# Patient Record
Sex: Female | Born: 1960 | Race: Black or African American | Hispanic: No | Marital: Married | Smoking: Current every day smoker
Health system: Southern US, Community
[De-identification: ages and names within clinical notes are randomized; demographics above are authoritative.]

## PROBLEM LIST (undated history)

## (undated) DIAGNOSIS — M47816 Spondylosis without myelopathy or radiculopathy, lumbar region: Secondary | ICD-10-CM

## (undated) DIAGNOSIS — M329 Systemic lupus erythematosus, unspecified: Secondary | ICD-10-CM

## (undated) DIAGNOSIS — E0500 Thyrotoxicosis with diffuse goiter without thyrotoxic crisis or storm: Secondary | ICD-10-CM

## (undated) DIAGNOSIS — IMO0002 Reserved for concepts with insufficient information to code with codable children: Secondary | ICD-10-CM

## (undated) DIAGNOSIS — M1990 Unspecified osteoarthritis, unspecified site: Secondary | ICD-10-CM

## (undated) HISTORY — DX: Unspecified osteoarthritis, unspecified site: M19.90

## (undated) HISTORY — PX: BREAST SURGERY: SHX581

## (undated) HISTORY — PX: THYROIDECTOMY: SHX17

## (undated) HISTORY — DX: Spondylosis without myelopathy or radiculopathy, lumbar region: M47.816

## (undated) HISTORY — PX: TUBAL LIGATION: SHX77

---

## 2011-10-12 DIAGNOSIS — D649 Anemia, unspecified: Secondary | ICD-10-CM | POA: Insufficient documentation

## 2011-10-12 DIAGNOSIS — F329 Major depressive disorder, single episode, unspecified: Secondary | ICD-10-CM | POA: Insufficient documentation

## 2011-10-12 DIAGNOSIS — K219 Gastro-esophageal reflux disease without esophagitis: Secondary | ICD-10-CM | POA: Insufficient documentation

## 2011-10-12 DIAGNOSIS — F32A Depression, unspecified: Secondary | ICD-10-CM | POA: Insufficient documentation

## 2011-10-12 DIAGNOSIS — G4700 Insomnia, unspecified: Secondary | ICD-10-CM | POA: Insufficient documentation

## 2011-10-12 DIAGNOSIS — E079 Disorder of thyroid, unspecified: Secondary | ICD-10-CM | POA: Insufficient documentation

## 2011-10-12 DIAGNOSIS — M2550 Pain in unspecified joint: Secondary | ICD-10-CM | POA: Insufficient documentation

## 2012-04-11 DIAGNOSIS — F410 Panic disorder [episodic paroxysmal anxiety] without agoraphobia: Secondary | ICD-10-CM | POA: Insufficient documentation

## 2012-10-23 DIAGNOSIS — F17200 Nicotine dependence, unspecified, uncomplicated: Secondary | ICD-10-CM | POA: Insufficient documentation

## 2012-10-23 DIAGNOSIS — L930 Discoid lupus erythematosus: Secondary | ICD-10-CM | POA: Insufficient documentation

## 2012-10-23 DIAGNOSIS — Z6829 Body mass index (BMI) 29.0-29.9, adult: Secondary | ICD-10-CM | POA: Insufficient documentation

## 2012-10-23 DIAGNOSIS — E785 Hyperlipidemia, unspecified: Secondary | ICD-10-CM | POA: Insufficient documentation

## 2014-01-13 ENCOUNTER — Encounter (HOSPITAL_COMMUNITY): Payer: Self-pay | Admitting: Emergency Medicine

## 2014-01-13 ENCOUNTER — Emergency Department (INDEPENDENT_AMBULATORY_CARE_PROVIDER_SITE_OTHER)
Admission: EM | Admit: 2014-01-13 | Discharge: 2014-01-13 | Disposition: A | Discharge status: Home / Self Care | Payer: Managed Care, Other (non HMO) | Source: Home / Self Care | Attending: Emergency Medicine | Admitting: Emergency Medicine

## 2014-01-13 DIAGNOSIS — K047 Periapical abscess without sinus: Secondary | ICD-10-CM

## 2014-01-13 MED ORDER — HYDROCODONE-ACETAMINOPHEN 5-325 MG PO TABS: 1.0000 | ORAL_TABLET | Freq: Four times a day (QID) | ORAL | Status: DC | PRN

## 2014-01-13 MED ORDER — PENICILLIN V POTASSIUM 500 MG PO TABS: 500.0000 mg | ORAL_TABLET | Freq: Four times a day (QID) | ORAL | Status: AC

## 2014-01-13 NOTE — ED Provider Notes (Addendum)
CSN: [id number]     Arrival date & time 01/13/14  2992 History   First MD Initiated Contact with Patient 01/13/14 [phone]     Chief Complaint  Patient presents with  . Dental Pain   (Consider location/radiation/quality/duration/timing/severity/associated sxs/prior Treatment) HPI She is a 54 year old woman here for evaluation of tooth pain. She states the pain started on Friday in the lower right jaw and has gradually been getting worse. This morning, she woke up with her right lower jaw swollen. She denies any fevers or chills. No nausea or vomiting. No drainage. She is relatively new to [place], and does not yet have a dentist here. She is planning on calling someone tomorrow.  She has been taking Tylenol with some mild improvement.  History reviewed. No pertinent past medical history. History reviewed. No pertinent past surgical history. History reviewed. No pertinent family history. History  Substance Use Topics  . Smoking status: Current Every Day Smoker -- 0.50 packs/day    Types: Cigarettes  . Smokeless tobacco: Not on file  . Alcohol Use: No   OB History    No data available     Review of Systems  Constitutional: Negative for fever.  HENT: Positive for dental problem.   Gastrointestinal: Negative for nausea and vomiting.    Allergies  Wellbutrin  Home Medications   Prior to Admission medications   Medication Sig Start Date End Date Taking? Authorizing Provider  diazepam (VALIUM) 5 MG tablet Take 5 mg by mouth every 6 (six) hours as needed for anxiety.   Yes Historical Provider, MD  fexofenadine-pseudoephedrine (ALLEGRA-D 24) 180-240 MG per 24 hr tablet Take 1 tablet by mouth daily.   Yes Historical Provider, MD  meloxicam (MOBIC) 15 MG tablet Take 15 mg by mouth daily.   Yes Historical Provider, MD  mometasone (ELOCON) 0.1 % ointment Apply topically daily.   Yes Historical Provider, MD  omeprazole (PRILOSEC) 20 MG capsule Take 20 mg by mouth daily.   Yes Historical  Provider, MD  zolpidem (AMBIEN) 10 MG tablet Take 10 mg by mouth at bedtime as needed for sleep.   Yes Historical Provider, MD  HYDROcodone-acetaminophen (NORCO) 5-325 MG per tablet Take 1 tablet by mouth every 6 (six) hours as needed for moderate pain. 01/13/14   Melony Overly, MD  penicillin v potassium (VEETID) 500 MG tablet Take 1 tablet (500 mg total) by mouth 4 (four) times daily. 01/13/14 01/20/14  Melony Overly, MD   BP 132/84 mmHg  Pulse 112  Temp(Src) 98.4 F (36.9 C) (Oral)  Resp 18  SpO2 100% Physical Exam  Constitutional: She is oriented to person, place, and time. She appears well-developed and well-nourished. No distress.  HENT:  Mouth/Throat: Oropharynx is clear and moist. Mucous membranes are not dry. Dental abscesses present.    Cardiovascular: Normal rate.   Pulmonary/Chest: Effort normal.  Neurological: She is alert and oriented to person, place, and time.    ED Course  Procedures (including critical care time) Labs Review Labs Reviewed - No data to display  Imaging Review No results found.   MDM   1. Dental abscess    Penicillin for 10 days. Norco 5-325 milligrams tablets, #30 provided. Alternate heat and ice to the jaw. Discussed importance of following up with a dentist.    Melony Overly, MD 01/13/14 [place], MD 01/13/14 [phone]

## 2014-01-13 NOTE — ED Notes (Signed)
Reports abscess of right lower bottom teeth.  Pain on set Friday.  States woke this morning with swelling of right side of face.  Mild relief with otc meds.

## 2014-01-13 NOTE — Discharge Instructions (Signed)
Your tooth is infected. Take penicillin 1 pill 4 times a day for 10 days. Use the Norco every 4-6 hours as needed for severe pain.  Do not drive while on this medicine. Ibuprofen and tylenol will help some. Alternate heat and ice to the jaw.  Follow up with a dentist as soon as possible.

## 2014-09-11 ENCOUNTER — Other Ambulatory Visit: Payer: Self-pay | Admitting: Nurse Practitioner

## 2014-09-11 ENCOUNTER — Other Ambulatory Visit: Payer: Self-pay

## 2014-09-11 DIAGNOSIS — Z1231 Encounter for screening mammogram for malignant neoplasm of breast: Secondary | ICD-10-CM

## 2014-11-04 DIAGNOSIS — M47816 Spondylosis without myelopathy or radiculopathy, lumbar region: Secondary | ICD-10-CM | POA: Insufficient documentation

## 2014-11-04 DIAGNOSIS — M5416 Radiculopathy, lumbar region: Secondary | ICD-10-CM | POA: Insufficient documentation

## 2014-12-02 DIAGNOSIS — M48061 Spinal stenosis, lumbar region without neurogenic claudication: Secondary | ICD-10-CM | POA: Insufficient documentation

## 2015-06-30 DIAGNOSIS — L930 Discoid lupus erythematosus: Secondary | ICD-10-CM | POA: Insufficient documentation

## 2015-06-30 DIAGNOSIS — H18419 Arcus senilis, unspecified eye: Secondary | ICD-10-CM | POA: Insufficient documentation

## 2015-06-30 DIAGNOSIS — Z79899 Other long term (current) drug therapy: Secondary | ICD-10-CM | POA: Insufficient documentation

## 2015-06-30 DIAGNOSIS — H18413 Arcus senilis, bilateral: Secondary | ICD-10-CM | POA: Insufficient documentation

## 2015-12-16 ENCOUNTER — Encounter: Payer: Self-pay | Admitting: Internal Medicine

## 2016-01-08 ENCOUNTER — Ambulatory Visit (INDEPENDENT_AMBULATORY_CARE_PROVIDER_SITE_OTHER): Payer: BLUE CROSS/BLUE SHIELD

## 2016-01-08 ENCOUNTER — Ambulatory Visit (INDEPENDENT_AMBULATORY_CARE_PROVIDER_SITE_OTHER): Payer: BLUE CROSS/BLUE SHIELD | Admitting: Podiatry

## 2016-01-08 ENCOUNTER — Encounter: Payer: Self-pay | Admitting: Podiatry

## 2016-01-08 VITALS — BP 105/80 | Resp 16 | Ht 67.0 in | Wt 153.0 lb

## 2016-01-08 DIAGNOSIS — M79674 Pain in right toe(s): Secondary | ICD-10-CM | POA: Diagnosis not present

## 2016-01-08 DIAGNOSIS — L600 Ingrowing nail: Secondary | ICD-10-CM

## 2016-01-08 NOTE — Patient Instructions (Signed)

## 2016-01-09 NOTE — Progress Notes (Signed)
Subjective:     Patient ID: Alison Carter, female   DOB: 1960/08/27, 56 y.o.   MRN: [id number]  HPI patient states she's had increased problems with ingrown toenails of the big toenail and second toenail right foot medial side and they have been increasingly sore when she tries to wear shoes or even sheets at night when the touch it. She is states she's not noted drainage and she also gets some occasional numbness in her feet   Review of Systems  All other systems reviewed and are negative.      Objective:   Physical Exam  Constitutional: She is oriented to person, place, and time.  Cardiovascular: Intact distal pulses.   Musculoskeletal: Normal range of motion.  Neurological: She is oriented to person, place, and time.  Skin: Skin is warm.  Nursing note and vitals reviewed.  neurovascular status is found to be intact with muscle strength adequate range of motion within normal limits with patient noted to have incurvated hallux and second nails right foot medial side that are painful when pressed and makes shoe gear difficult. Patient also has mild numbness sensations but I was unable to elicit any change and had a previous surgery right fifth digit which is healed well with no problems. The patient has no distal drainage noted and has good digital perfusion and is well oriented 3     Assessment:     Ingrown toenail deformity right hallux second toes right that are painful when pressed with possible mild neuropathic changes    Plan:     H&P conditions reviewed and patient wants correction. I explained procedures and risk and patient understands this wanting surgery and today I infiltrated the right hallux and second toe with 120 mg Xylocaine Marcaine mixture and under sterile conditions remove the medial border of the right hallux and second toe exposed matrix and applied phenol 3 applications 30 seconds followed by alcohol lavage and sterile dressing. Gave instructions on soaks and  reappoint

## 2016-01-10 ENCOUNTER — Encounter: Payer: Self-pay | Admitting: Podiatry

## 2016-01-10 NOTE — Progress Notes (Signed)
Telephone call Spoke with patient this afternoon regarding blisters to her toes. Patient was last seen on 01/08/16 by Dr. Paulla Dolly for ingrown toenails. Partial nail avulsions performed with chemical matrixectomy.   Patient states that she has blisters on her toes today and is concerned.   Plan:  1. Rx for doxycycline 100mg  #20 called into pharmacy 2. Continue epsom salt or antibacterial soaks 3. Call office Monday mo rning for follow up appt with Dr. Paulla Dolly

## 2016-01-11 ENCOUNTER — Telehealth: Payer: Self-pay | Admitting: Podiatry

## 2016-01-11 ENCOUNTER — Encounter (HOSPITAL_BASED_OUTPATIENT_CLINIC_OR_DEPARTMENT_OTHER): Payer: Self-pay | Admitting: Emergency Medicine

## 2016-01-11 ENCOUNTER — Emergency Department (HOSPITAL_BASED_OUTPATIENT_CLINIC_OR_DEPARTMENT_OTHER): Payer: BLUE CROSS/BLUE SHIELD

## 2016-01-11 ENCOUNTER — Emergency Department (HOSPITAL_BASED_OUTPATIENT_CLINIC_OR_DEPARTMENT_OTHER)
Admission: EM | Admit: 2016-01-11 | Discharge: 2016-01-12 | Disposition: A | Discharge status: Home / Self Care | Payer: BLUE CROSS/BLUE SHIELD | Attending: Emergency Medicine | Admitting: Emergency Medicine

## 2016-01-11 DIAGNOSIS — Y793 Surgical instruments, materials and orthopedic devices (including sutures) associated with adverse incidents: Secondary | ICD-10-CM | POA: Insufficient documentation

## 2016-01-11 DIAGNOSIS — T819XXA Unspecified complication of procedure, initial encounter: Secondary | ICD-10-CM

## 2016-01-11 DIAGNOSIS — Z79899 Other long term (current) drug therapy: Secondary | ICD-10-CM | POA: Insufficient documentation

## 2016-01-11 DIAGNOSIS — F17210 Nicotine dependence, cigarettes, uncomplicated: Secondary | ICD-10-CM | POA: Diagnosis not present

## 2016-01-11 HISTORY — DX: Systemic lupus erythematosus, unspecified: M32.9

## 2016-01-11 HISTORY — DX: Thyrotoxicosis with diffuse goiter without thyrotoxic crisis or storm: E05.00

## 2016-01-11 HISTORY — DX: Reserved for concepts with insufficient information to code with codable children: IMO0002

## 2016-01-11 NOTE — Telephone Encounter (Signed)
Patient called this evening for concern of swelling to her foot. Patient recently had ingrown toenails removed on 01/08/2016 care of Dr. Paulla Dolly. Patient called on 01/10/2016 for concern of blisters to the respective digits of the ingrown toenail removal. At that time prescription for antibiotic was called into the pharmacy.  Today the patient states that she fell and her blisters ruptured. She states that she is now swollen up to the level of the ankle. Patient also states that her toenails are discolored with a grayish discoloration.  Plan of care: Today I recommended that the patient follow up with the emergency department due to worsening conditions. Instructed patient to call in the a.m. for follow-up appointment.  Edrick Kins, DPM [facility]  Dr. Edrick Kins, [address]                                        [place], [place] [address]                Office [phone]  Fax [phone]

## 2016-01-11 NOTE — ED Triage Notes (Addendum)
R foot pain. Pt had ingrown toe nail removed on 1st two toe nails on Thursday. Pt reports fluid is built up around both toes. Pt states she called her doctor who prescribed abx. Pt states she tripped and fell tonight and the blisters burst. Doc advised pt to be seen in ED. Denies LOC. Pt foot swollen with open wounds to both toes.

## 2016-01-11 NOTE — ED Provider Notes (Addendum)
[facility] Provider Note: Georgena Spurling, MD, FACEP  CSN: [id number] MRN: [id number] ARRIVAL: 01/11/16 at 2017 ROOM: MH08/MH08  By signing my name below, I, Neta Mends, attest that this documentation has been prepared under the direction and in the presence of Shanon Rosser, MD . Electronically Signed: Neta Mends, ED Scribe. 01/11/2016. 11:16 PM.   CHIEF COMPLAINT  Foot Pain   HISTORY OF PRESENT ILLNESS  Alison Carter is a 56 y.o. female who had ingrown toenails removed on 1st and 2nd right toes on 01/08/16. Pt has been soaking her toes in antibacterial soap twice a day and applying dressings as instructed, and first noticed blisters forming around the toes yesterday. She contacted her podiatrist who thought she may have had a reaction to the phenol used in the procedure and he prescribed doxycycline. Pt reports that she tripped and fell this evening and the blisters on her toes burst. Pt notes that the pain in her toes resolved after the blisters broke. Pt reports that she Pt has an appointment with her podiatrist tomorrow. There is no new deformity of the toes and sensation and gross motor function are intact.   Past Medical History:  Diagnosis Date  . Graves disease   . Lupus     Past Surgical History:  Procedure Laterality Date  . BREAST SURGERY    . THYROIDECTOMY    . TUBAL LIGATION      No family history on file.  Social History  Substance Use Topics  . Smoking status: Current Every Day Smoker    Packs/day: 0.50    Types: Cigarettes  . Smokeless tobacco: Never Used  . Alcohol use No    Prior to Admission medications   Medication Sig Start Date End Date Taking? Authorizing Provider  doxycycline (VIBRAMYCIN) 100 MG capsule Take 100 mg by mouth 2 (two) times daily.   Yes Historical Provider, MD  acetaminophen (TYLENOL) 500 MG tablet Take 2 mg by mouth.    Historical Provider, MD  diazepam (VALIUM) 5 MG tablet Take 5 mg by mouth every 6 (six)  hours as needed for anxiety.    Historical Provider, MD  escitalopram (LEXAPRO) 10 MG tablet TK 1/2 T PO D FOR 1 WEEK THEN INCREASE TO 1 T D QAM WITH FOOD 12/17/15   Historical Provider, MD  fexofenadine-pseudoephedrine (ALLEGRA-D 24) 180-240 MG per 24 hr tablet Take 1 tablet by mouth daily.    Historical Provider, MD  gabapentin (NEURONTIN) 100 MG capsule TK 1 TO 3 CS PO HS PRF LEG PAIN 12/08/15   Historical Provider, MD  hydroxychloroquine (PLAQUENIL) 200 MG tablet Take 200 mg by mouth. 10/02/13   Historical Provider, MD  ibuprofen (ADVIL,MOTRIN) 600 MG tablet Take 600 mg by mouth. 07/03/15 07/02/16  Historical Provider, MD  mometasone (ELOCON) 0.1 % ointment Apply topically daily.    Historical Provider, MD  omeprazole (PRILOSEC) 20 MG capsule Take 20 mg by mouth daily.    Historical Provider, MD  traMADol (ULTRAM) 50 MG tablet TK 1 T PO Q 6 H PRN 12/02/15   Historical Provider, MD  zolpidem (AMBIEN) 10 MG tablet Take 10 mg by mouth at bedtime as needed for sleep.    Historical Provider, MD    Allergies Wellbutrin [bupropion]   REVIEW OF SYSTEMS  Negative except as noted here or in the History of Present Illness.   PHYSICAL EXAMINATION  Initial Vital Signs Blood pressure 151/81, pulse 100, temperature 97.9 F (36.6 C), temperature source Oral, resp.  rate 18, height 5\' 7"  (1.702 m), weight 153 lb (69.4 kg), SpO2 100 %.  Examination General: Well-developed, well-nourished female in no acute distress; appearance consistent with age of record HENT: normocephalic; atraumatic Eyes: pupils equal, round and reactive to light; extraocular muscles intact; arcus senilis bilaterally Neck: supple Heart: regular rate and rhythm Lungs: clear to auscultation bilaterally Abdomen: soft; nondistended; nontender; bowel sounds present Extremities: Right 1st and 2nd toes pink with ruptured bullae, partial denudation of epidermis and obvious recent surgical changes to nails, grossly intact tendon function  and sensation with minimal tenderness; no deformity; pulses normal Neurologic: Awake, alert and oriented; motor function intact in all extremities and symmetric; no facial droop Skin: Warm and dry Psychiatric: Normal mood and affect     RESULTS  Summary of this visit's results, reviewed by myself:   EKG Interpretation  Date/Time:    Ventricular Rate:    PR Interval:    QRS Duration:   QT Interval:    QTC Calculation:   R Axis:     Text Interpretation:        Laboratory Studies: No results found for this or any previous visit (from the past 24 hour(s)). Imaging Studies: Dg Foot Complete Right  Result Date: 01/11/2016 CLINICAL DATA:  Pain following fall EXAM: RIGHT FOOT COMPLETE - 3+ VIEW COMPARISON:  January 08, 2016 FINDINGS: Frontal, oblique, and lateral views were obtained. There is no fracture or dislocation. Joint spaces appear normal. There is a small inferior calcaneal spur. There is no soft tissue air or calcification. No erosive change. IMPRESSION: No evident fracture or dislocation. No apparent arthropathy. No soft tissue air or calcification. Minimal inferior calcaneal spur. Electronically Signed   By: Lowella Grip III M.D.   On: 01/11/2016 21:09    ED COURSE  Nursing notes and initial vitals signs, including pulse oximetry, reviewed.  Vitals:   01/11/16 2039 01/11/16 2342  BP: 151/81 130/64  Pulse: 100 77  Resp: 18 17  Temp: 97.9 F (36.6 C)   TempSrc: Oral   SpO2: 100% 100%  Weight: 153 lb (69.4 kg)   Height: 5\' 7"  (1.702 m)    We'll dress with Xeroform gauze and she will follow-up with her podiatrist tomorrow.  PROCEDURES    ED DIAGNOSES     ICD-9-CM ICD-10-CM   1. Postoperative or surgical complication, initial encounter 998.9 T81.9XXA    I personally performed the services described in this documentation, which was scribed in my presence. The recorded information has been reviewed and is accurate.     Shanon Rosser, MD 01/11/16 [place], MD 01/12/16 [phone]

## 2016-01-12 ENCOUNTER — Ambulatory Visit (INDEPENDENT_AMBULATORY_CARE_PROVIDER_SITE_OTHER): Payer: BLUE CROSS/BLUE SHIELD | Admitting: Podiatry

## 2016-01-12 ENCOUNTER — Encounter: Payer: Self-pay | Admitting: Podiatry

## 2016-01-12 VITALS — BP 131/89 | Temp 97.6°F

## 2016-01-12 DIAGNOSIS — L03031 Cellulitis of right toe: Secondary | ICD-10-CM | POA: Diagnosis not present

## 2016-01-12 MED ORDER — HYDROCODONE-ACETAMINOPHEN 10-325 MG PO TABS
1.0000 | ORAL_TABLET | Freq: Three times a day (TID) | ORAL | 0 refills | Status: AC | PRN
Start: 2016-01-12 — End: ?

## 2016-01-14 NOTE — Progress Notes (Signed)
Subjective:     Patient ID: Alison Carter, female   DOB: [birth year], 56 y.o.   MRN: [id number]  HPI patient presents stating that a few days after the nails that she started to develop some blisters on her big toe and second toe and she did go to the emergency room and they put dressings on it and she is on an antibiotic currently. Patient is not having any pain and states that this areas we did the ingrown toenails are healing well   Review of Systems     Objective:   Physical Exam Neurovascular status was intact muscle strength was adequate with patient found to have small irritations on the right and left hallux and the proximal portion measuring approximately 7 mm in length by 2 mm in width with no subcutaneous exposure and no proximal edema erythema drainage noted. The areas that we did the surgery are doing well and crusting over in a normal fashion from ingrown toenail procedure and patient has had no systemic indications of infection    Assessment:     Localized irritation which may have been a response to soaking or other modality with no indication of systemic pathology    Plan:     Instructed on continued soaking and allowing them to air dry and did write a prescription for Silvadene to try to reduce the irritation of tissue. I then went ahead and advised on continuing the antibiotic at the current time and patient will be seen back for [place] to recheck again in 1 week or earlier if any issues should occur

## 2016-01-15 ENCOUNTER — Telehealth: Payer: Self-pay | Admitting: *Deleted

## 2016-01-15 ENCOUNTER — Encounter: Payer: Self-pay | Admitting: Podiatry

## 2016-01-15 NOTE — Telephone Encounter (Signed)
Pt states she is still having issues with her toes. I spoke with pt and she states her toes are still swollen and oozy. I told pt that they may be swollen and oozy, and painful for about 4-6 weeks I told her to continue doxycycline, the epsom salt soaks and lightly coated antibiotic ointment bandaid. I told pt to keep her 01/21/2016 appt, and if she should have increase swelling, pain, or streaking or fever to call for an earlier appt.

## 2016-01-21 ENCOUNTER — Ambulatory Visit: Payer: BLUE CROSS/BLUE SHIELD | Admitting: Podiatry

## 2016-01-22 ENCOUNTER — Ambulatory Visit: Payer: BLUE CROSS/BLUE SHIELD | Admitting: Podiatry

## 2016-01-23 ENCOUNTER — Encounter: Payer: Self-pay | Admitting: Podiatry

## 2016-01-23 ENCOUNTER — Ambulatory Visit (INDEPENDENT_AMBULATORY_CARE_PROVIDER_SITE_OTHER): Payer: BLUE CROSS/BLUE SHIELD | Admitting: Podiatry

## 2016-01-23 ENCOUNTER — Ambulatory Visit (INDEPENDENT_AMBULATORY_CARE_PROVIDER_SITE_OTHER): Payer: BLUE CROSS/BLUE SHIELD

## 2016-01-23 VITALS — BP 110/74 | HR 109 | Resp 16

## 2016-01-23 DIAGNOSIS — L03031 Cellulitis of right toe: Secondary | ICD-10-CM | POA: Diagnosis not present

## 2016-01-25 NOTE — Progress Notes (Signed)
Subjective:     Patient ID: Alison Carter, female   DOB: 1960/06/19, 56 y.o.   MRN: [id number]  HPI patient presents stating I was just concerned about this right big toe and wanted to make sure it was healing okay. I am not having any pain currently   Review of Systems     Objective:   Physical Exam Neurovascular status is intact toes are warm with crusted tissue right big toe localized in nature. There is no necrotic tissue formation and it does appear to gradually be peeling off and I did not note proximal edema erythema or drainage noted    Assessment:     Patient is gradually healing from nail surgery with continued discoloration which I do believe is going to resolve over time with no indication currently of necrosis or infection    Plan:     Explained continued soaks bandage is and the tissue should gradually sloughed off. I explained that if any dark tissue should form or anything indications of redness drainage or other issues I want her to reappoint immediately

## 2016-02-02 ENCOUNTER — Ambulatory Visit (AMBULATORY_SURGERY_CENTER): Payer: Self-pay | Admitting: *Deleted

## 2016-02-02 VITALS — Ht 67.0 in | Wt 159.0 lb

## 2016-02-02 DIAGNOSIS — Z1211 Encounter for screening for malignant neoplasm of colon: Secondary | ICD-10-CM

## 2016-02-02 MED ORDER — NA SULFATE-K SULFATE-MG SULF 17.5-3.13-1.6 GM/177ML PO SOLN: ORAL | 0 refills | Status: AC

## 2016-02-02 NOTE — Progress Notes (Signed)
Patient denies any allergies to eggs or soy. Patient denies any problems with anesthesia/sedation. Patient denies any oxygen use at home and does not take any diet/weight loss medications. EMMI education declined by patient.  

## 2016-02-16 ENCOUNTER — Ambulatory Visit (AMBULATORY_SURGERY_CENTER): Payer: BLUE CROSS/BLUE SHIELD | Admitting: Internal Medicine

## 2016-02-16 ENCOUNTER — Encounter: Payer: Self-pay | Admitting: Internal Medicine

## 2016-02-16 VITALS — BP 140/77 | HR 72 | Temp 98.4°F | Resp 21 | Ht 67.0 in

## 2016-02-16 DIAGNOSIS — D125 Benign neoplasm of sigmoid colon: Secondary | ICD-10-CM

## 2016-02-16 DIAGNOSIS — Z1212 Encounter for screening for malignant neoplasm of rectum: Secondary | ICD-10-CM | POA: Diagnosis not present

## 2016-02-16 DIAGNOSIS — D120 Benign neoplasm of cecum: Secondary | ICD-10-CM | POA: Diagnosis not present

## 2016-02-16 DIAGNOSIS — Z1211 Encounter for screening for malignant neoplasm of colon: Secondary | ICD-10-CM | POA: Diagnosis not present

## 2016-02-16 DIAGNOSIS — K635 Polyp of colon: Secondary | ICD-10-CM

## 2016-02-16 MED ORDER — SODIUM CHLORIDE 0.9 % IV SOLN: 500.0000 mL | INTRAVENOUS | Status: AC

## 2016-02-16 MED ORDER — DEXTROSE 5 % IV SOLN: INTRAVENOUS | Status: AC

## 2016-02-16 NOTE — Progress Notes (Signed)
A/ox3, pleased with MAC, report to RN 

## 2016-02-16 NOTE — Op Note (Signed)
[place] Patient Name: Alison Carter Procedure Date: 02/16/2016 11:09 AM MRN: [id number] Endoscopist: Jerene Bears , MD Age: 56 Referring MD:  Date of Birth: 1960/10/28 Gender: Female Account #: [id number] Procedure:                Colonoscopy Indications:              Screening for colorectal malignant neoplasm, This                            is the patient's first colonoscopy Medicines:                Monitored Anesthesia Care Procedure:                Pre-Anesthesia Assessment:                           - Prior to the procedure, a History and Physical                            was performed, and patient medications and                            allergies were reviewed. The patient's tolerance of                            previous anesthesia was also reviewed. The risks                            and benefits of the procedure and the sedation                            options and risks were discussed with the patient.                            All questions were answered, and informed consent                            was obtained. Prior Anticoagulants: The patient has                            taken no previous anticoagulant or antiplatelet                            agents. ASA Grade Assessment: II - A patient with                            mild systemic disease. After reviewing the risks                            and benefits, the patient was deemed in                            satisfactory condition to undergo the procedure.  After obtaining informed consent, the colonoscope                            was passed under direct vision. Throughout the                            procedure, the patient's blood pressure, pulse, and                            oxygen saturations were monitored continuously. The                            Model PCF-H190L [id number]) scope was introduced                            through the anus and  advanced to the the cecum,                            identified by appendiceal orifice and ileocecal                            valve. The colonoscopy was performed without                            difficulty. The patient tolerated the procedure                            well. The quality of the bowel preparation was                            good. The ileocecal valve, appendiceal orifice, and                            rectum were photographed. Scope In: 11:18:43 AM Scope Out: 11:33:42 AM Scope Withdrawal Time: 0 hours 11 minutes 51 seconds  Total Procedure Duration: 0 hours 14 minutes 59 seconds  Findings:                 The perianal and digital rectal examinations were                            normal.                           A 6 mm polyp was found in the ileocecal valve. The                            polyp was sessile. The polyp was removed with a                            cold snare. Resection and retrieval were complete.                           A 5 mm polyp was found in the sigmoid colon. The  polyp was sessile. The polyp was removed with a                            cold snare. Resection and retrieval were complete.                           A few small-mouthed diverticula were found in the                            descending colon.                           The exam was otherwise without abnormality on                            direct and retroflexion views. Complications:            No immediate complications. Estimated Blood Loss:     Estimated blood loss was minimal. Impression:               - One 6 mm polyp at the ileocecal valve, removed                            with a cold snare. Resected and retrieved.                           - One 5 mm polyp in the sigmoid colon, removed with                            a cold snare. Resected and retrieved.                           - Very mild diverticulosis in the descending colon.                            - The examination was otherwise normal on direct                            and retroflexion views. Recommendation:           - Patient has a contact number available for                            emergencies. The signs and symptoms of potential                            delayed complications were discussed with the                            patient. Return to normal activities tomorrow.                            Written discharge instructions were provided to the  patient.                           - Resume previous diet.                           - Continue present medications.                           - Await pathology results.                           - Repeat colonoscopy is recommended. The                            colonoscopy date will be determined after pathology                            results from today's exam become available for                            review. Jerene Bears, MD 02/16/2016 11:36:36 AM This report has been signed electronically.

## 2016-02-16 NOTE — Progress Notes (Signed)
Called to room to assist during endoscopic procedure.  Patient ID and intended procedure confirmed with present staff. Received instructions for my participation in the procedure from the performing physician.  

## 2016-02-16 NOTE — Patient Instructions (Signed)
YOU HAD AN ENDOSCOPIC PROCEDURE TODAY AT THE  ENDOSCOPY CENTER:   Refer to the procedure report that was given to you for any specific questions about what was found during the examination.  If the procedure report does not answer your questions, please call your gastroenterologist to clarify.  If you requested that your care partner not be given the details of your procedure findings, then the procedure report has been included in a sealed envelope for you to review at your convenience later.  YOU SHOULD EXPECT: Some feelings of bloating in the abdomen. Passage of more gas than usual.  Walking can help get rid of the air that was put into your GI tract during the procedure and reduce the bloating. If you had a lower endoscopy (such as a colonoscopy or flexible sigmoidoscopy) you may notice spotting of blood in your stool or on the toilet paper. If you underwent a bowel prep for your procedure, you may not have a normal bowel movement for a few days.  Please Note:  You might notice some irritation and congestion in your nose or some drainage.  This is from the oxygen used during your procedure.  There is no need for concern and it should clear up in a day or so.  SYMPTOMS TO REPORT IMMEDIATELY:   Following lower endoscopy (colonoscopy or flexible sigmoidoscopy):  Excessive amounts of blood in the stool  Significant tenderness or worsening of abdominal pains  Swelling of the abdomen that is new, acute  Fever of 100F or higher   For urgent or emergent issues, a gastroenterologist can be reached at any hour by calling [phone].   DIET:  We do recommend a small meal at first, but then you may proceed to your regular diet.  Drink plenty of fluids but you should avoid alcoholic beverages for 24 hours.  ACTIVITY:  You should plan to take it easy for the rest of today and you should NOT DRIVE or use heavy machinery until tomorrow (because of the sedation medicines used during the test).     FOLLOW UP: Our staff will call the number listed on your records the next business day following your procedure to check on you and address any questions or concerns that you may have regarding the information given to you following your procedure. If we do not reach you, we will leave a message.  However, if you are feeling well and you are not experiencing any problems, there is no need to return our call.  We will assume that you have returned to your regular daily activities without incident.  If any biopsies were taken you will be contacted by phone or by letter within the next 1-3 weeks.  Please call us at [phone] if you have not heard about the biopsies in 3 weeks.    SIGNATURES/CONFIDENTIALITY: You and/or your care partner have signed paperwork which will be entered into your electronic medical record.  These signatures attest to the fact that that the information above on your After Visit Summary has been reviewed and is understood.  Full responsibility of the confidentiality of this discharge information lies with you and/or your care-partner.  Read all of the handouts given to you by your recovery room nurse. Thank-you for choosing us for your healthcare needs today. 

## 2016-02-17 ENCOUNTER — Telehealth: Payer: Self-pay | Admitting: *Deleted

## 2016-02-17 NOTE — Telephone Encounter (Signed)
  Follow up Call-  Call back number 02/16/2016  Post procedure Call Back phone  # [phone]  Permission to leave phone message Yes     Patient questions:  Do you have a fever, pain , or abdominal swelling? No. Pain Score  0 *  Have you tolerated food without any problems? Yes.    Have you been able to return to your normal activities? Yes.    Do you have any questions about your discharge instructions: Diet   No. Medications  No. Follow up visit  No.  Do you have questions or concerns about your Care? No.  Actions: * If pain score is 4 or above: No action needed, pain <4.

## 2016-02-24 ENCOUNTER — Encounter: Payer: Self-pay | Admitting: Internal Medicine

## 2016-03-04 ENCOUNTER — Encounter: Payer: Self-pay | Admitting: Podiatry

## 2016-03-04 ENCOUNTER — Ambulatory Visit (INDEPENDENT_AMBULATORY_CARE_PROVIDER_SITE_OTHER): Payer: BLUE CROSS/BLUE SHIELD | Admitting: Podiatry

## 2016-03-04 DIAGNOSIS — L03031 Cellulitis of right toe: Secondary | ICD-10-CM | POA: Diagnosis not present

## 2016-03-05 NOTE — Progress Notes (Signed)
Subjective:     Patient ID: Alison Carter, female   DOB: 1960/07/12, 56 y.o.   MRN: [id number]  HPI patient presents stating she wanted to have these nails checked again stating that she's feeling better with diminished pain but she still has discoloration   Review of Systems     Objective:   Physical Exam Neurovascular status intact with discoloration hallux second toe but no blistering and no open skin formation currently. There is good cap fill time and the toes are warm and the nailbeds are healing with crusted tissue    Assessment:     Doing well with improvement of a localized condition hallux second toe with no indications of necrosis or pathology    Plan:     Instructed on continued warm water soaks padding of the areas and the should heal uneventfully. Reappoint as needed

## 2017-01-03 DIAGNOSIS — Z79899 Other long term (current) drug therapy: Secondary | ICD-10-CM | POA: Diagnosis not present

## 2017-01-03 DIAGNOSIS — M5432 Sciatica, left side: Secondary | ICD-10-CM | POA: Diagnosis not present

## 2017-01-03 DIAGNOSIS — F17210 Nicotine dependence, cigarettes, uncomplicated: Secondary | ICD-10-CM | POA: Insufficient documentation

## 2017-01-03 DIAGNOSIS — M545 Low back pain: Secondary | ICD-10-CM | POA: Diagnosis present

## 2017-01-04 ENCOUNTER — Emergency Department (HOSPITAL_COMMUNITY)
Admission: EM | Admit: 2017-01-04 | Discharge: 2017-01-04 | Disposition: A | Discharge status: Home / Self Care | Payer: BLUE CROSS/BLUE SHIELD | Attending: Emergency Medicine | Admitting: Emergency Medicine

## 2017-01-04 ENCOUNTER — Encounter (HOSPITAL_COMMUNITY): Payer: Self-pay

## 2017-01-04 DIAGNOSIS — M5432 Sciatica, left side: Secondary | ICD-10-CM

## 2017-01-04 MED ORDER — IBUPROFEN 400 MG PO TABS: 400.0000 mg | ORAL_TABLET | Freq: Three times a day (TID) | ORAL | 0 refills | Status: AC | PRN

## 2017-01-04 MED ORDER — METHOCARBAMOL 500 MG PO TABS: 500.0000 mg | ORAL_TABLET | Freq: Three times a day (TID) | ORAL | 0 refills | Status: AC | PRN

## 2017-01-04 MED ORDER — PREDNISONE 20 MG PO TABS: 40.0000 mg | ORAL_TABLET | Freq: Every day | ORAL | 0 refills | Status: AC

## 2017-01-04 NOTE — ED Provider Notes (Signed)
[facility] DEPT Provider Note   CSN: [id number] Arrival date & time: 01/03/17  2334     History   Chief Complaint Chief Complaint  Patient presents with  . Back Pain    HPI Alison Carter is a 57 y.o. female.  HPI Patient is a 57 year old female presents the emergency department 3 days of worsening left low back pain with radiation towards her left buttock and down her left thigh.  She has a history of sciatica and a history of herniated disc.  She states she was fine with a lumbar herniated disc on MRI 2 years ago.  She follows it with her sports medicine physician in [place] for intermittent injections.  She reports worsening of her pain over the past several days unrelieved by her normal gabapentin.  No weakness of her legs.  Denies abdominal pain.  No urinary complaints.  No fevers or chills.  Pain is moderate in severity.  And constant   Past Medical History:  Diagnosis Date  . Arthritis   . Degenerative arthritis of lumbar spine   . Graves disease   . Lupus     Patient Active Problem List   Diagnosis Date Noted  . 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 Drug therapy 06/30/2015  . Lumbar stenosis 12/02/2014  . Lumbar radiculopathy 11/04/2014  . 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 Adult body mass index 29.0-29.9 10/23/2012  . Hyperlipidemia 10/23/2012  . Lupus erythematosus 10/23/2012  . Tobacco use disorder 10/23/2012  . Panic disorder 04/11/2012  . Anemia 10/12/2011  . Depression 10/12/2011  . Disorder of thyroid 10/12/2011  . Esophageal reflux 10/12/2011  . Insomnia 10/12/2011  . Pain in joint 10/12/2011    Past Surgical History:  Procedure Laterality Date  . BREAST SURGERY    . THYROIDECTOMY    . TUBAL LIGATION      OB History    No data available        Home Medications    Prior to Admission medications   Medication Sig Start Date End Date Taking? Authorizing Provider  diazepam (VALIUM) 5 MG tablet Take 5 mg by mouth every 6 (six) hours as needed for anxiety.    [provider]  escitalopram (LEXAPRO) 10 MG tablet TK 1/2 T PO D FOR 1 WEEK THEN INCREASE TO 1 T D QAM WITH FOOD 12/17/15   [provider]  fexofenadine-pseudoephedrine (ALLEGRA-D 24) 180-240 MG per 24 hr tablet Take 1 tablet by mouth daily.    [provider]  gabapentin (NEURONTIN) 100 MG capsule TK 1 TO 3 CS PO HS PRF LEG PAIN 12/08/15   [provider]  HYDROcodone-acetaminophen (NORCO) 10-325 MG tablet Take 1 tablet by mouth every 8 (eight) hours as needed. 01/12/16   Wallene Huh, DPM  hydroxychloroquine (PLAQUENIL) 200 MG tablet Take 200 mg by mouth. 10/02/13   [provider]  ibuprofen (ADVIL,MOTRIN) 400 MG tablet Take 1 tablet (400 mg total) by mouth every 8 (eight) hours as needed. 01/04/17   Jola Schmidt, MD  methocarbamol (ROBAXIN) 500 MG tablet Take 1 tablet (500 mg total) by mouth every 8 (eight) hours as needed for muscle spasms. 01/04/17   Jola Schmidt, MD  mometasone (ELOCON) 0.1 % ointment Apply topically daily.    [provider]  Na Sulfate-K Sulfate-Mg Sulf 17.5-3.13-1.6 GM/180ML SOLN  Suprep (no substitutions)-TAKE AS DIRECTED. 02/02/16   Pyrtle, Lajuan Lines, MD  omeprazole (PRILOSEC) 20 MG capsule Take 20 mg by mouth daily.    [provider]  predniSONE (DELTASONE) 20 MG tablet Take 2 tablets (40 mg total) by mouth daily for 5 days. 01/04/17 01/09/17  Jola Schmidt, MD  temazepam (RESTORIL) 7.5 MG capsule Take 1 capsule by mouth at bedtime as needed. 11/04/15   [provider]  traMADol (ULTRAM) 50 MG tablet TK 1 T PO Q 6 H PRN 12/02/15   [provider]  zolpidem (AMBIEN) 10 MG tablet Take 10 mg by mouth at bedtime as needed for sleep.    [provider]    Family  History Family History  Problem Relation Age of Onset  . Colon cancer Neg Hx     Social History Social History   Tobacco Use  . Smoking status: Current Every Day Smoker    Packs/day: 0.50    Types: Cigarettes  . Smokeless tobacco: Never Used  Substance Use Topics  . Alcohol use: No  . Drug use: No     Allergies   Wellbutrin [bupropion]   Review of Systems Review of Systems  All other systems reviewed and are negative.    Physical Exam Updated Vital Signs BP 135/86 (BP Location: Right Arm)   Pulse 92   Temp 98.3 F (36.8 C) (Oral)   Resp 18   SpO2 100%   Physical Exam  Constitutional: She is oriented to person, place, and time. She appears well-developed and well-nourished.  HENT:  Head: Normocephalic.  Eyes: EOM are normal.  Neck: Normal range of motion.  Pulmonary/Chest: Effort normal.  Abdominal: She exhibits no distension.  Musculoskeletal: Normal range of motion.  No thoracic or lumbar point tenderness.  Paralumbar tenderness on the left with tenderness in the left sciatic groove.  Normal pulses left foot.  Full range of motion and normal strength in major muscle groups of the left lower extremity.  Neurological: She is alert and oriented to person, place, and time.  Psychiatric: She has a normal mood and affect.  Nursing note and vitals reviewed.    ED Treatments / Results  Labs (all labs ordered are listed, but only abnormal results are displayed) Labs Reviewed - No data to display  EKG  EKG Interpretation None       Radiology No results found.  Procedures Procedures (including critical care time)  Medications Ordered in ED Medications - No data to display   Initial Impression / Assessment and Plan / ED Course  I have reviewed the triage vital signs and the nursing notes.  Pertinent labs & imaging results that were available during my care of the patient were reviewed by me and considered in my medical decision making (see chart  for details).     Suspect left-sided sciatica.  Close primary care follow-up.  Patient understands return to the ER for new or worsening symptoms  Final Clinical Impressions(s) / ED Diagnoses   Final diagnoses:  Sciatica, left side    ED Discharge Orders        Ordered    ibuprofen (ADVIL,MOTRIN) 400 MG tablet  Every 8 hours PRN     01/04/17 0101    methocarbamol (ROBAXIN) 500 MG tablet  Every 8 hours PRN     01/04/17 0101    predniSONE (DELTASONE) 20 MG tablet  Daily     01/04/17 0101       Jola Schmidt, MD 01/04/17 [phone]

## 2017-01-04 NOTE — ED Notes (Signed)
Bed: [facility] Expected date:  Expected time:  Means of arrival:  Comments: 

## 2017-01-04 NOTE — ED Triage Notes (Signed)
Pt complains of lower left back pain since Saturday and worse on Sunday, no injury noted but does lift at work

## 2018-12-15 IMAGING — DX DG FOOT COMPLETE 3+V*R*
3 series · 3 of 3 positions shown · non-contrast
Comparison: January 08, 2016

CLINICAL DATA: Pain following fall

EXAM:
RIGHT FOOT COMPLETE - 3+ VIEW

[foot ap]
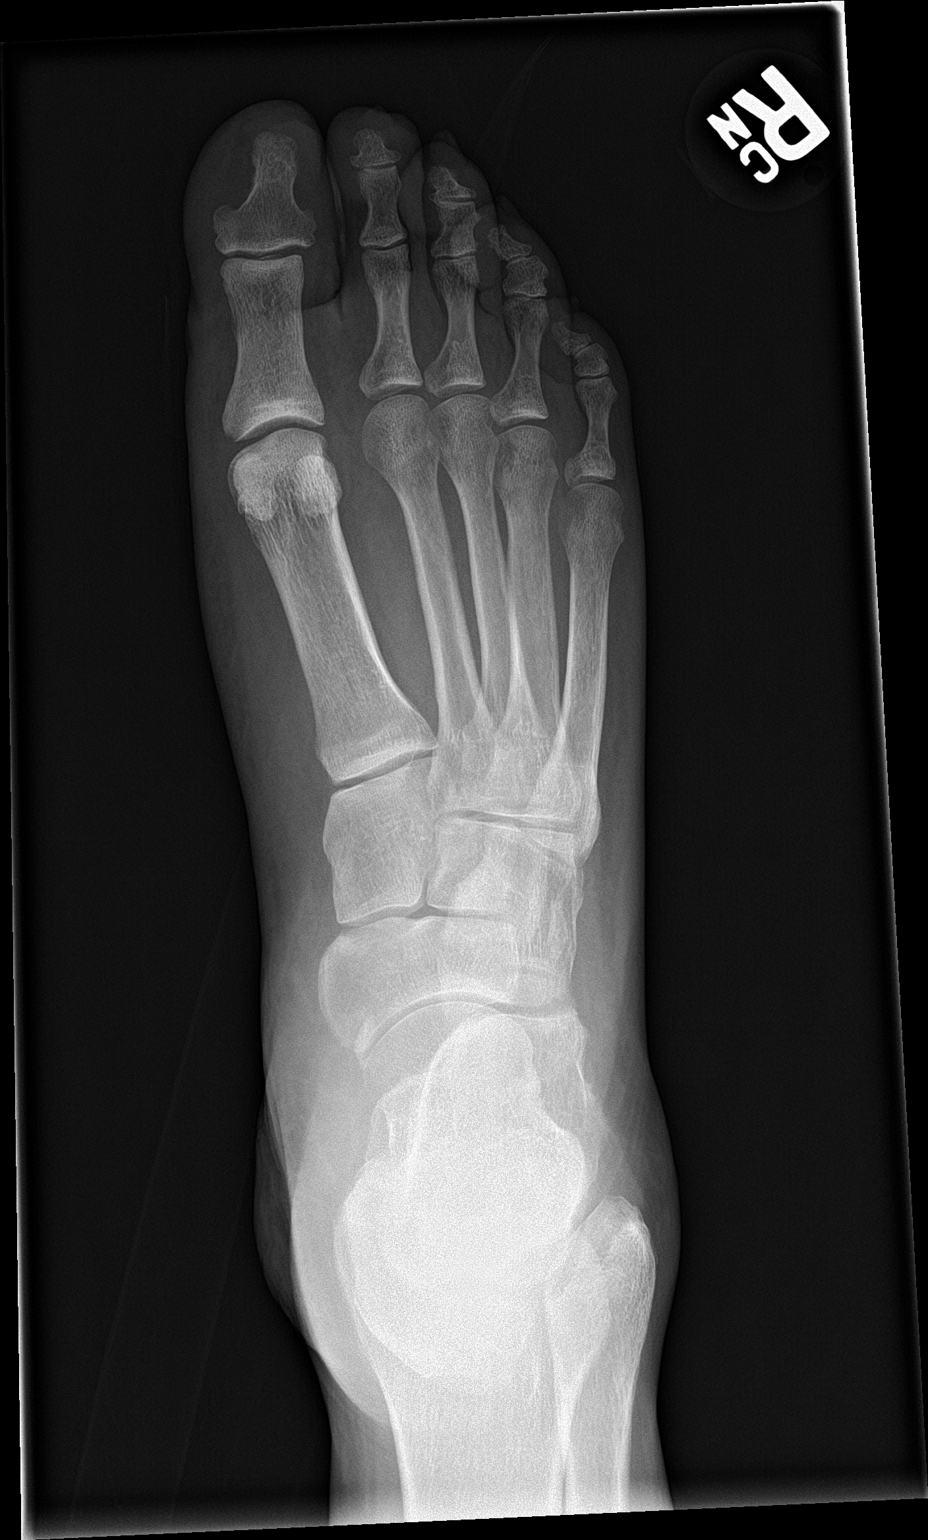

[foot obl]
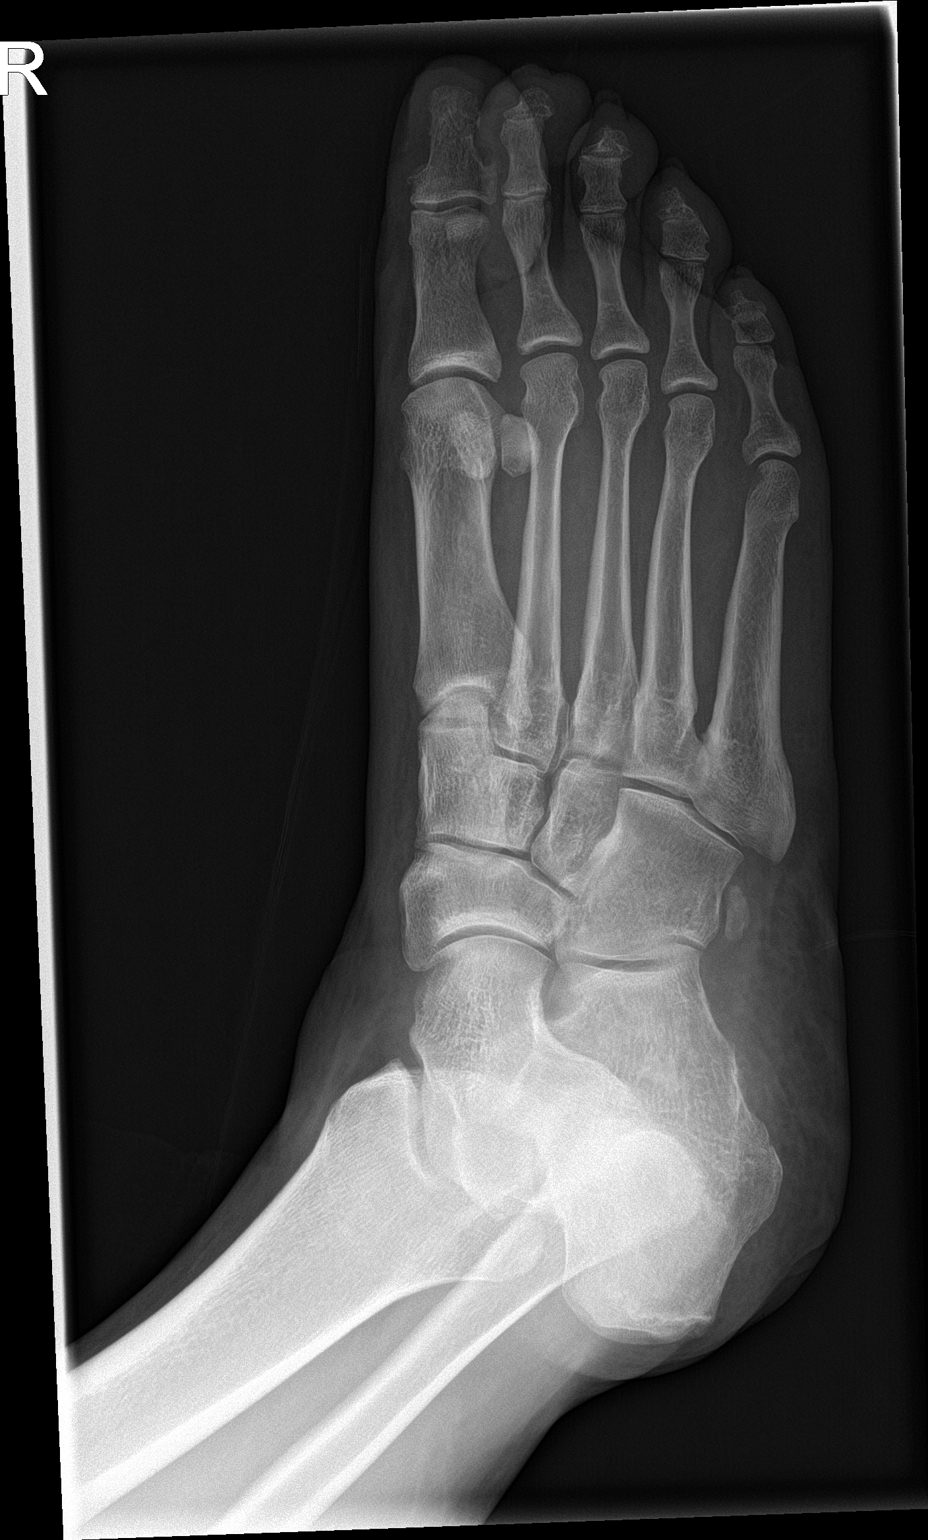

[foot lat]
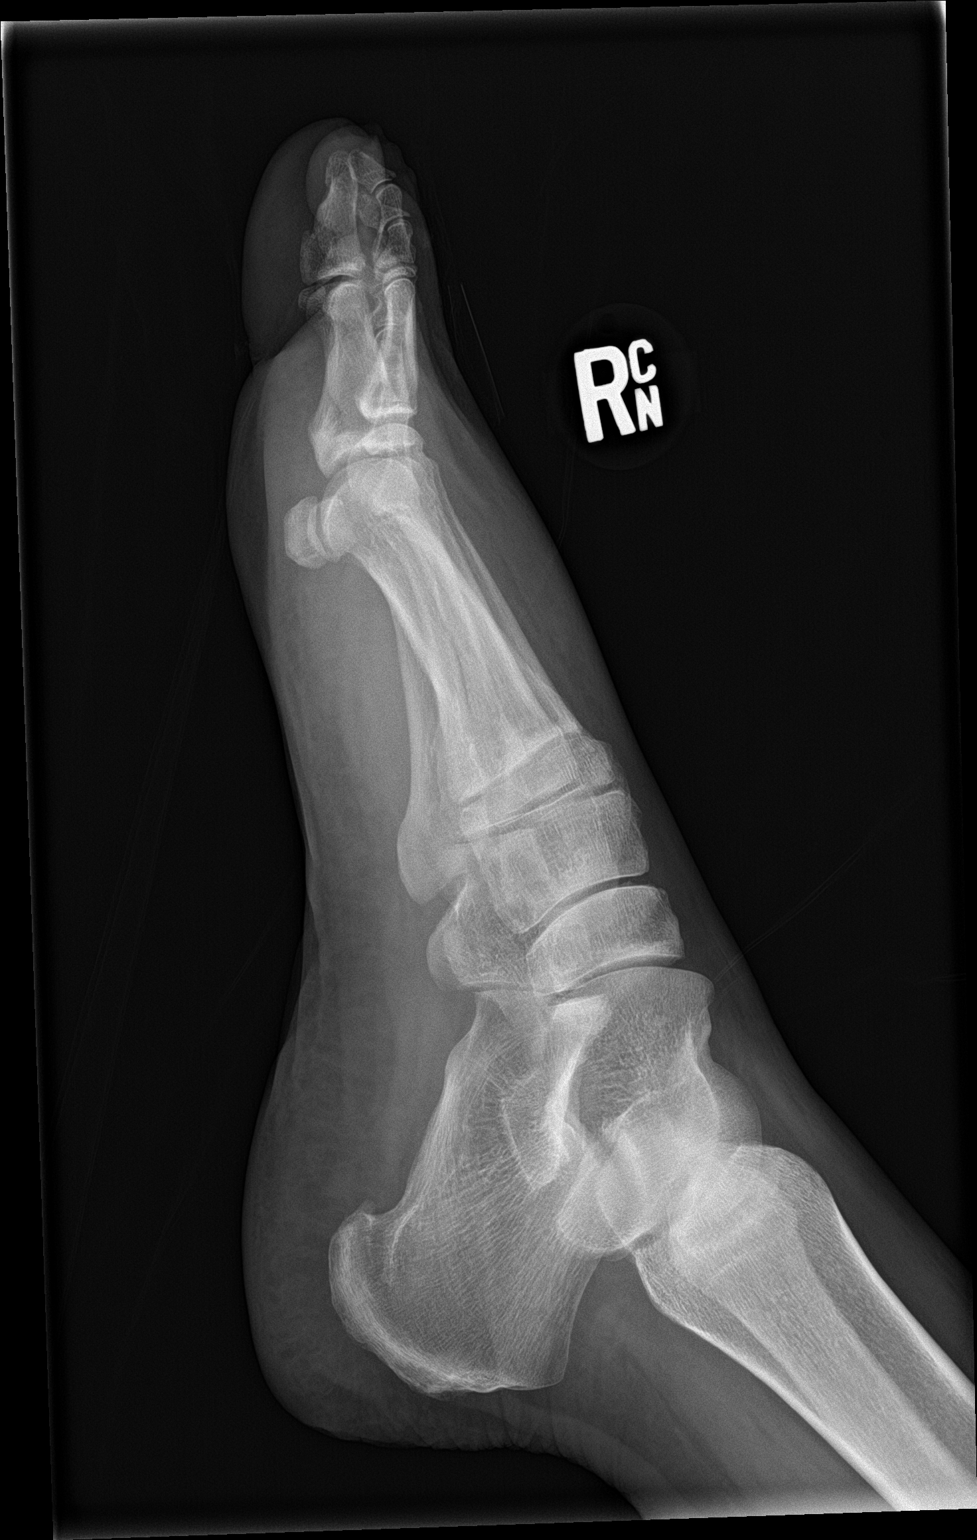

[3 of 3 positions shown; findings below may reference images not displayed]

FINDINGS: Frontal, oblique, and lateral views were obtained. There is no
fracture or dislocation. Joint spaces appear normal. There is a
small inferior calcaneal spur. There is no soft tissue air or
calcification. No erosive change.
IMPRESSION: No evident fracture or dislocation. No apparent arthropathy. No soft
tissue air or calcification. Minimal inferior calcaneal spur.
